# Patient Record
Sex: Male | Born: 1985 | Race: White | Hispanic: No | Marital: Single | Smoking: Current every day smoker
Health system: Southern US, Community
[De-identification: ages and names within clinical notes are randomized; demographics above are authoritative.]

---

## 2013-08-14 ENCOUNTER — Encounter (HOSPITAL_BASED_OUTPATIENT_CLINIC_OR_DEPARTMENT_OTHER): Payer: Self-pay | Admitting: Emergency Medicine

## 2013-08-14 ENCOUNTER — Emergency Department (HOSPITAL_BASED_OUTPATIENT_CLINIC_OR_DEPARTMENT_OTHER)
Admission: EM | Admit: 2013-08-14 | Discharge: 2013-08-14 | Disposition: A | Discharge status: Home / Self Care | Payer: Worker's Compensation | Attending: Emergency Medicine | Admitting: Emergency Medicine

## 2013-08-14 ENCOUNTER — Emergency Department (HOSPITAL_BASED_OUTPATIENT_CLINIC_OR_DEPARTMENT_OTHER): Payer: Worker's Compensation

## 2013-08-14 DIAGNOSIS — W010XXA Fall on same level from slipping, tripping and stumbling without subsequent striking against object, initial encounter: Secondary | ICD-10-CM | POA: Insufficient documentation

## 2013-08-14 DIAGNOSIS — F17200 Nicotine dependence, unspecified, uncomplicated: Secondary | ICD-10-CM | POA: Insufficient documentation

## 2013-08-14 DIAGNOSIS — Y939 Activity, unspecified: Secondary | ICD-10-CM | POA: Insufficient documentation

## 2013-08-14 DIAGNOSIS — S52133A Displaced fracture of neck of unspecified radius, initial encounter for closed fracture: Secondary | ICD-10-CM | POA: Insufficient documentation

## 2013-08-14 DIAGNOSIS — Y9289 Other specified places as the place of occurrence of the external cause: Secondary | ICD-10-CM | POA: Insufficient documentation

## 2013-08-14 DIAGNOSIS — S52131A Displaced fracture of neck of right radius, initial encounter for closed fracture: Secondary | ICD-10-CM

## 2013-08-14 DIAGNOSIS — Y990 Civilian activity done for income or pay: Secondary | ICD-10-CM | POA: Insufficient documentation

## 2013-08-14 MED ORDER — HYDROCODONE-ACETAMINOPHEN 5-325 MG PO TABS: 1.0000 | ORAL_TABLET | Freq: Four times a day (QID) | ORAL | Status: AC | PRN

## 2013-08-14 NOTE — ED Notes (Signed)
Pt states he was at work [employer] tonight and fell at 1230 - states he slipped on a mat and hurt his right forearm and right elbow. Reports employer does require UDS and ETOH testing.

## 2013-08-14 NOTE — ED Provider Notes (Signed)
CSN: [id number]     Arrival date & time 08/14/13  0145 History   First MD Initiated Contact with Patient 08/14/13 0410     Chief Complaint  Patient presents with  . Fall     (Consider location/radiation/quality/duration/timing/severity/associated sxs/prior Treatment) HPI This is a 28 year old male who slipped at work about 12:30 this morning and landed on his right elbow. He is having moderate to severe pain in his right elbow, worse with flexion or extension and especially with pronation and supination. There is associated ecchymosis and swelling. The pain radiates to his right forearm. There is no sensory or functional deficit distally. He denies other injury.  History reviewed. No pertinent past medical history. History reviewed. No pertinent past surgical history. History reviewed. No pertinent family history. History  Substance Use Topics  . Smoking status: Current Every Day Smoker  . Smokeless tobacco: Never Used  . Alcohol Use: Yes     Comment: occasionally    Review of Systems  All other systems reviewed and are negative.   Allergies  Review of patient's allergies indicates no known allergies.  Home Medications  No current outpatient prescriptions on file. BP 137/78  Pulse 73  Temp(Src) 97.8 F (36.6 C) (Oral)  Resp 16  Ht 6' 2.5" (1.892 m)  Wt 272 lb (123.378 kg)  BMI 34.47 kg/m2  SpO2 100%  Physical Exam General: Well-developed, well-nourished male in no acute distress; appearance consistent with age of record HENT: normocephalic; atraumatic Eyes: pupils equal, round and reactive to light; extraocular muscles intact Neck: supple; nontender Heart: regular rate and rhythm Lungs: clear to auscultation bilaterally Abdomen: soft; nondistended; nontender Extremities: No deformity; decreased range of motion of right elbow swelling and pain; ecchymosis and swelling of right elbow; tenderness of right elbow with pain on movement; right forearm, wrist and hand  without tenderness and neurovascularly intact Neurologic: Awake, alert and oriented; motor function intact in all extremities and symmetric; no facial droop Skin: Warm and dry Psychiatric: Normal mood and affect    ED Course  Procedures (including critical care time)   MDM   Nursing notes and vitals signs, including pulse oximetry, reviewed.  Summary of this visit's results, reviewed by myself:  Imaging Studies: Dg Elbow Complete Right  08/14/2013   CLINICAL DATA:  Fall with elbow and arm pain.  EXAM: RIGHT ELBOW - COMPLETE 3+ VIEW  COMPARISON:  None.  FINDINGS: There is suspected transverse fracture through the radial neck, with trabecular disruption and cortical irregularity noted in multiple projections. Lack of definite elbow joint effusion decreases certainty. Chronic fragmentation of the lateral and anterior trochlear notch of the ulna.  IMPRESSION: Suspect nondisplaced radial neck fracture.   Electronically Signed   By: Tiburcio PeaJonathan  Watts M.D.   On: 08/14/2013 02:30   Dg Forearm Right  08/14/2013   CLINICAL DATA:  Fall with elbow and arm pain.  EXAM: RIGHT FOREARM - 2 VIEW  COMPARISON:  None.  FINDINGS: Question horizontal fracture through the radial neck, nondisplaced. No confirmatory elbow joint effusion. Chronic lucencies through the lateral and ventral aspect of the ulnar trochlear notch.  IMPRESSION: Suspect nondisplaced radial neck fracture.   Electronically Signed   By: Tiburcio PeaJonathan  Watts M.D.   On: 08/14/2013 02:28        Hanley SeamenJohn L Carole Doner, MD 08/14/13 [phone]

## 2013-08-14 NOTE — ED Notes (Signed)
UDS/BAT performed on Pt

## 2015-03-30 IMAGING — CR DG FOREARM 2V*R*
2 series · 2 of 2 positions shown · non-contrast
Comparison: None.

CLINICAL DATA: Fall with elbow and arm pain.

EXAM:
RIGHT FOREARM - 2 VIEW

[x forearm ap right]
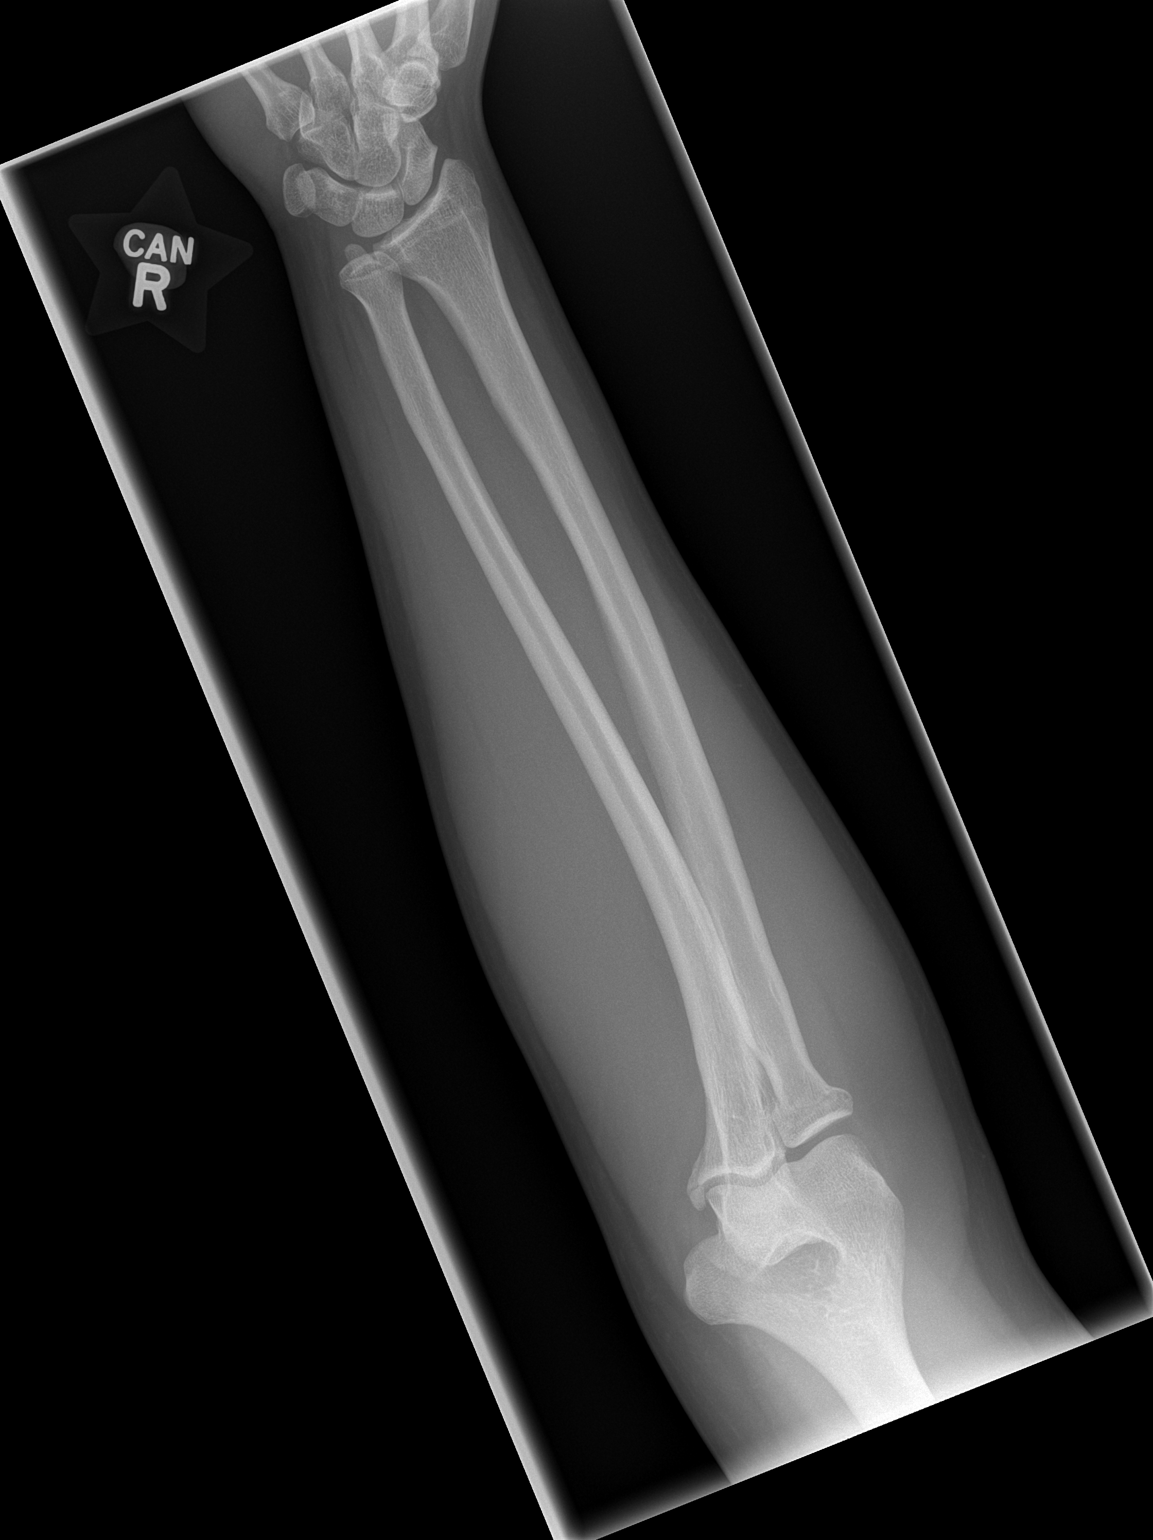

[x forearm lat right]
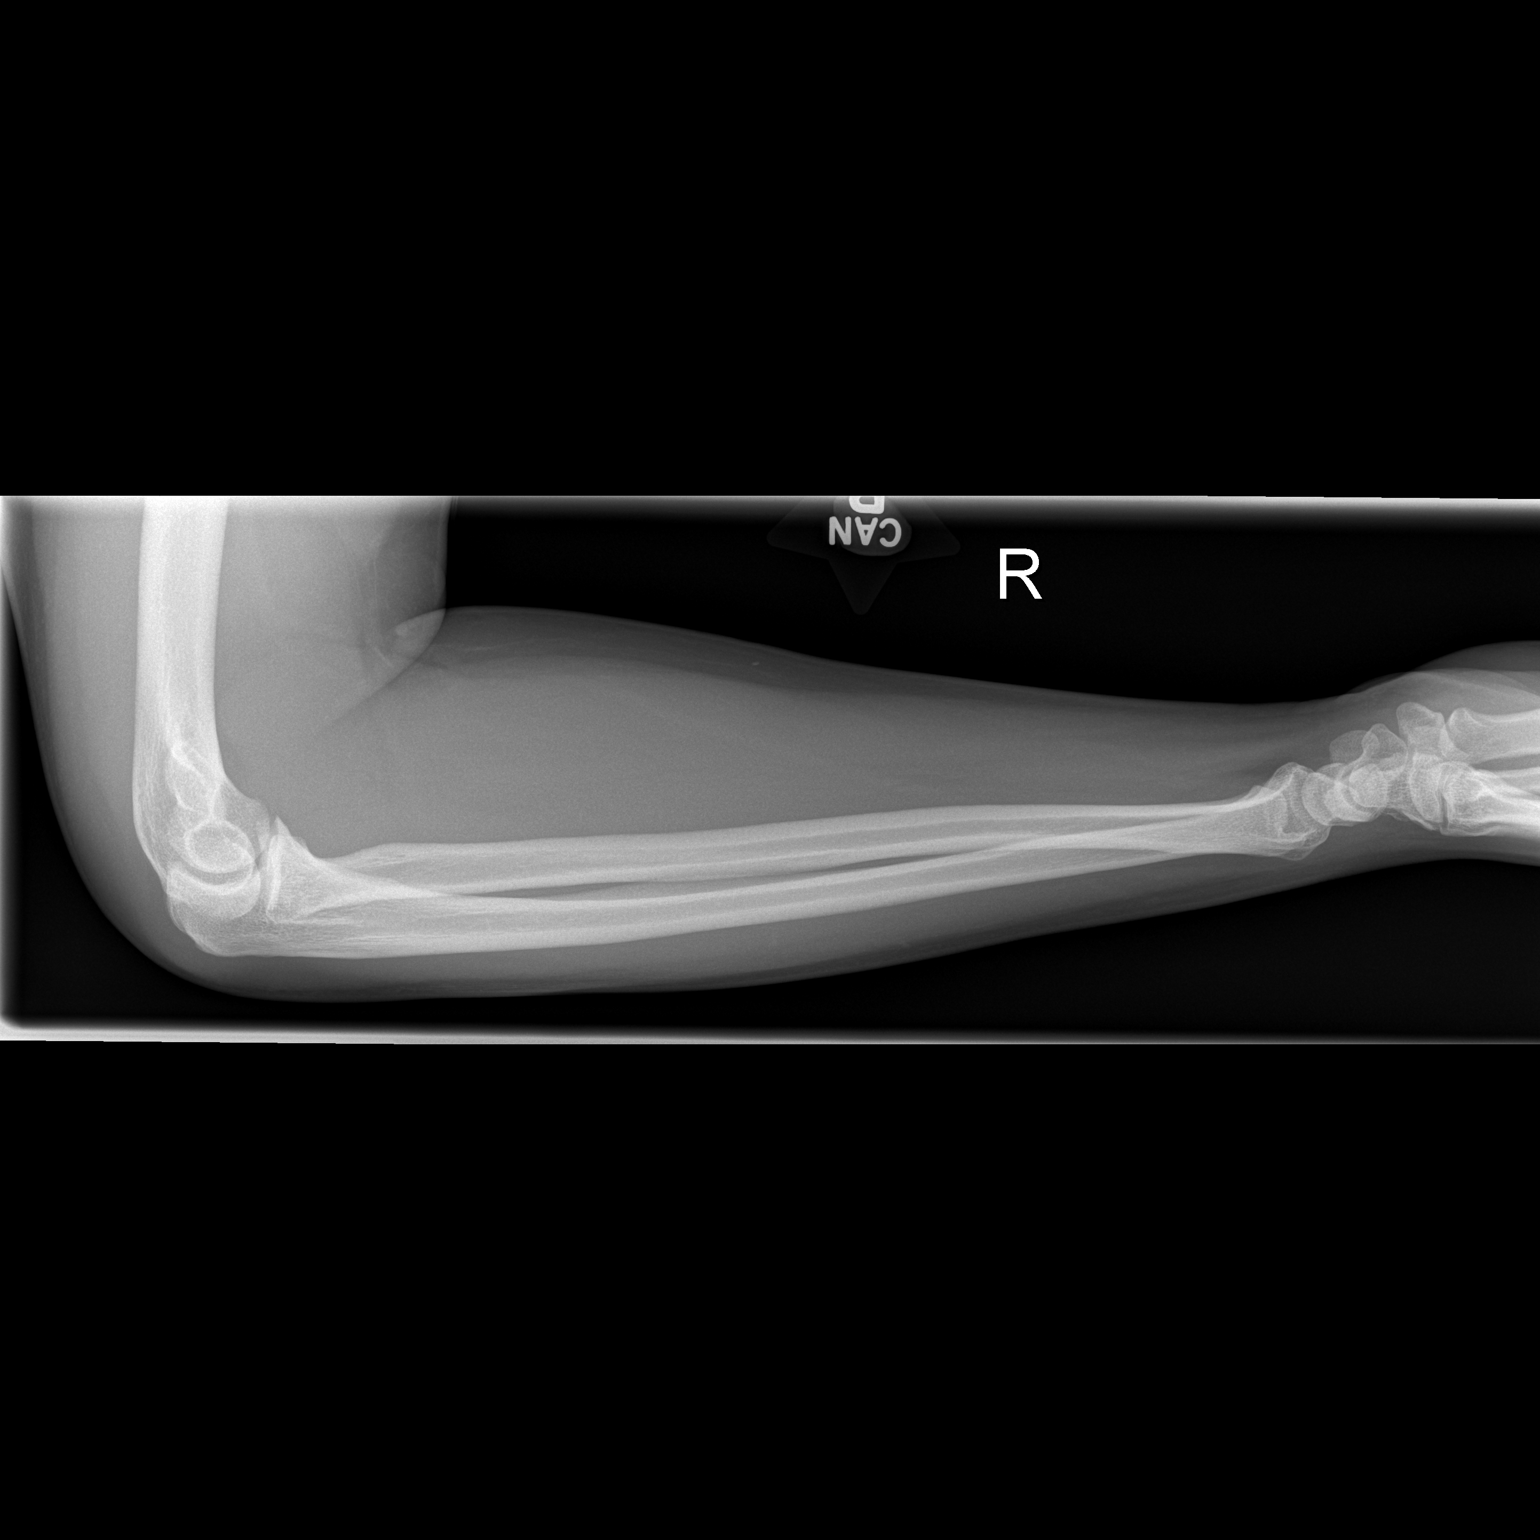

[2 of 2 positions shown; findings below may reference images not displayed]

FINDINGS: Question horizontal fracture through the radial neck, nondisplaced.
No confirmatory elbow joint effusion. Chronic lucencies through the
lateral and ventral aspect of the ulnar trochlear notch.
IMPRESSION: Suspect nondisplaced radial neck fracture.

## 2015-03-30 IMAGING — CR DG ELBOW COMPLETE 3+V*R*
4 series · 4 of 4 positions shown · non-contrast
Comparison: None.

CLINICAL DATA: Fall with elbow and arm pain.

EXAM:
RIGHT ELBOW - COMPLETE 3+ VIEW

[x elbow joint ap right]
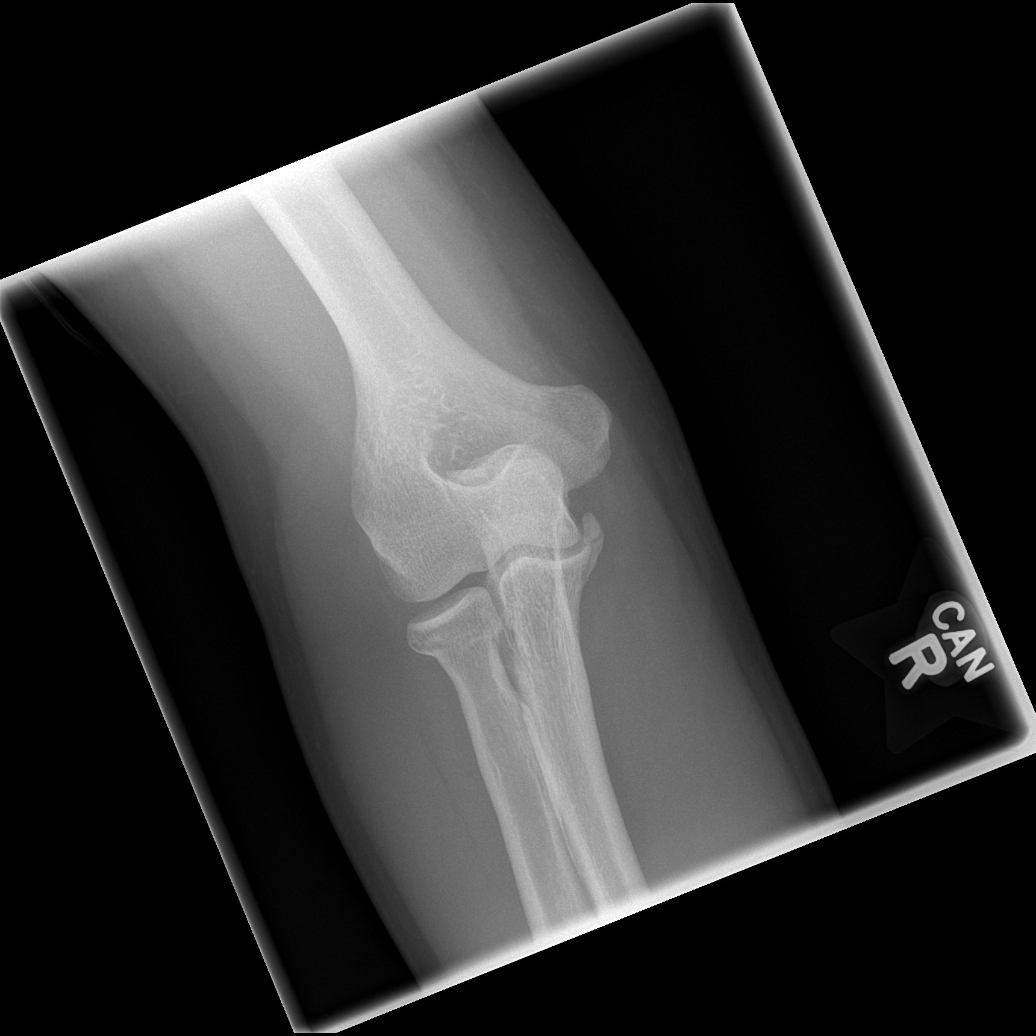

[x elbow joint obl. right (1 of 2)]
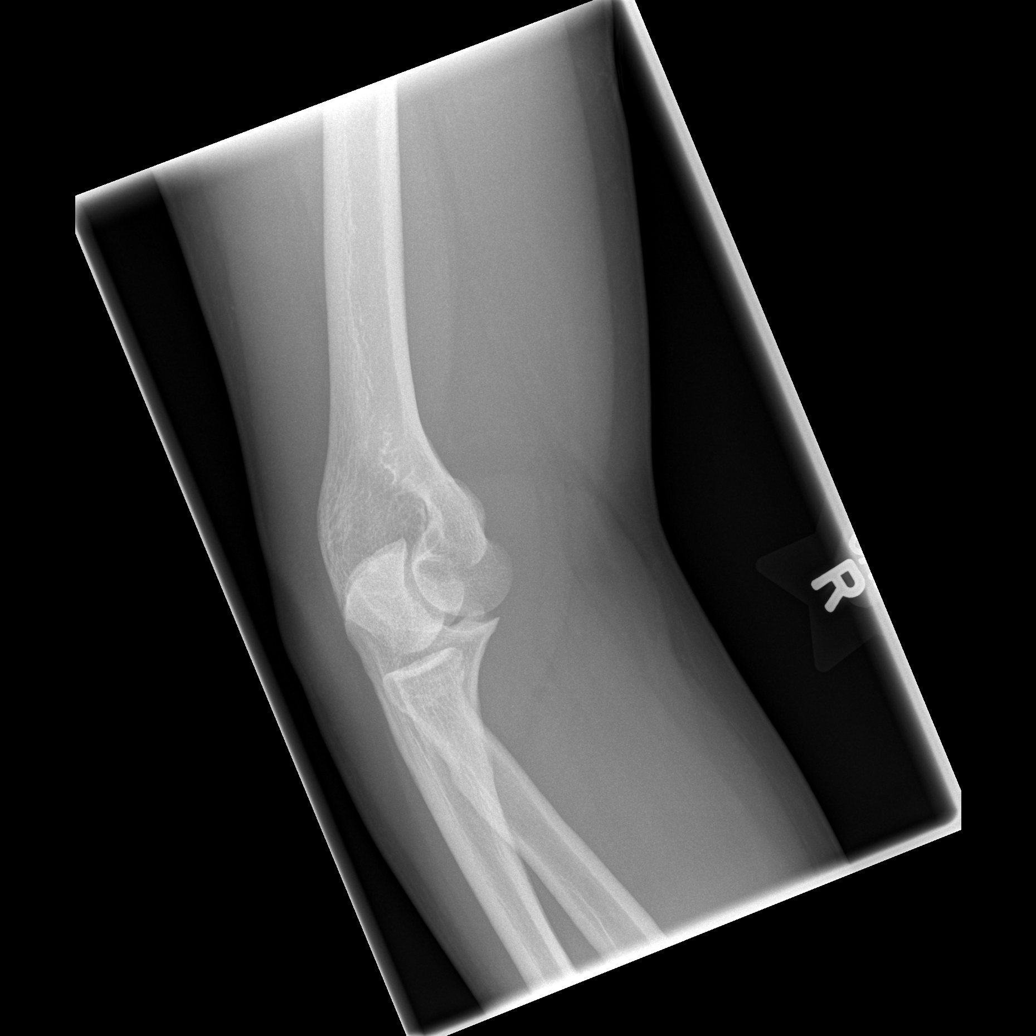

[x elbow joint obl. right (2 of 2)]
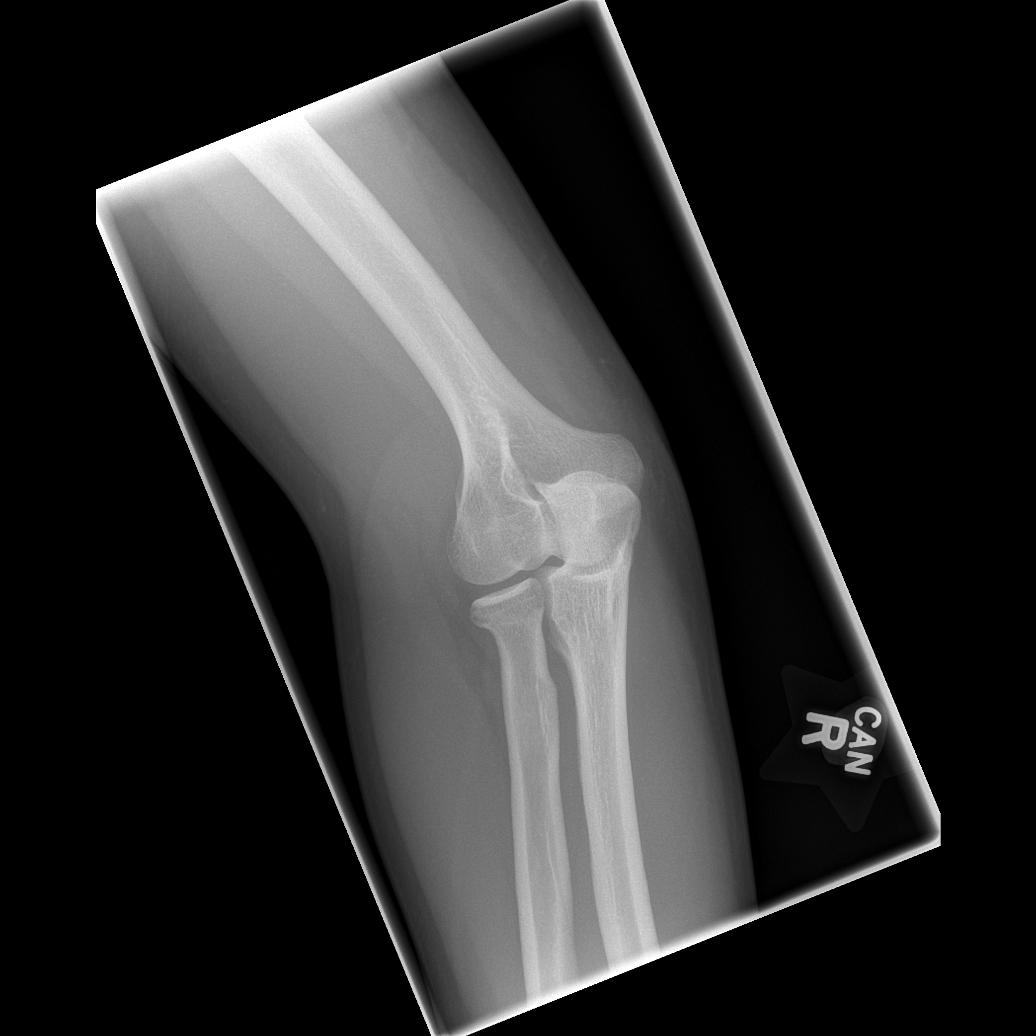

[x elbow joint lat right]
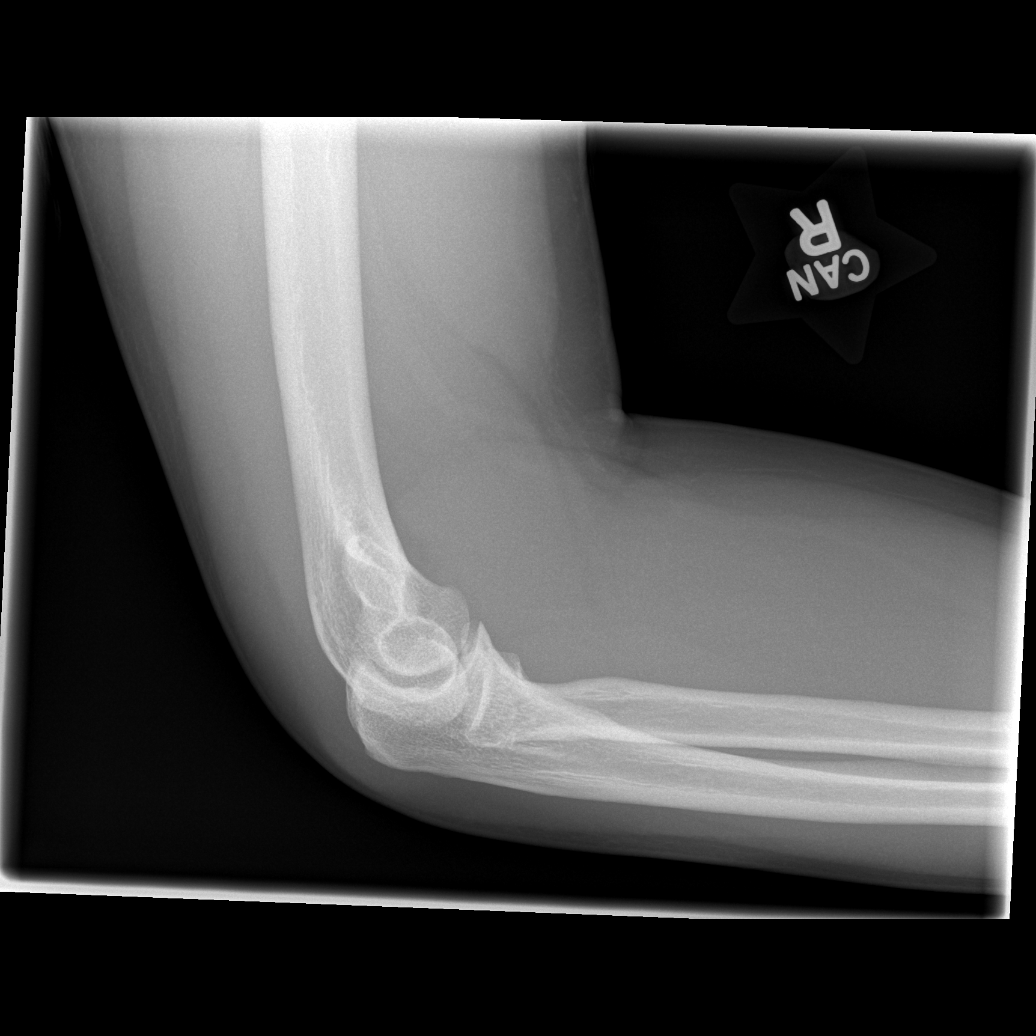

[4 of 4 positions shown; findings below may reference images not displayed]

FINDINGS: There is suspected transverse fracture through the radial neck, with
trabecular disruption and cortical irregularity noted in multiple
projections. Lack of definite elbow joint effusion decreases
certainty. Chronic fragmentation of the lateral and anterior
trochlear notch of the ulna.
IMPRESSION: Suspect nondisplaced radial neck fracture.
